# Patient Record
Sex: Female | Born: 1975 | Race: Asian | Hispanic: No | Marital: Married | Smoking: Never smoker
Health system: Southern US, Community
[De-identification: ages and names within clinical notes are randomized; demographics above are authoritative.]

---

## 2018-10-06 ENCOUNTER — Other Ambulatory Visit: Payer: Self-pay

## 2018-10-06 ENCOUNTER — Ambulatory Visit (HOSPITAL_COMMUNITY)
Admission: EM | Admit: 2018-10-06 | Discharge: 2018-10-06 | Disposition: A | Discharge status: Home / Self Care | Payer: BC Managed Care – PPO | Attending: Family Medicine | Admitting: Family Medicine

## 2018-10-06 ENCOUNTER — Encounter (HOSPITAL_COMMUNITY): Payer: Self-pay | Admitting: Emergency Medicine

## 2018-10-06 DIAGNOSIS — J111 Influenza due to unidentified influenza virus with other respiratory manifestations: Secondary | ICD-10-CM

## 2018-10-06 DIAGNOSIS — R69 Illness, unspecified: Secondary | ICD-10-CM | POA: Insufficient documentation

## 2018-10-06 MED ORDER — OSELTAMIVIR PHOSPHATE 75 MG PO CAPS: 75.0000 mg | ORAL_CAPSULE | Freq: Two times a day (BID) | ORAL | 0 refills | Status: AC

## 2018-10-06 MED ORDER — ACETAMINOPHEN 325 MG PO TABS
ORAL_TABLET | ORAL | Status: AC
  Filled 2018-10-06: qty 2

## 2018-10-06 MED ORDER — ACETAMINOPHEN 325 MG PO TABS
650.0000 mg | ORAL_TABLET | Freq: Once | ORAL | Status: AC
  Administered 2018-10-06: 650 mg via ORAL

## 2018-10-06 MED ORDER — HYDROCODONE-HOMATROPINE 5-1.5 MG/5ML PO SYRP: 5.0000 mL | ORAL_SOLUTION | Freq: Four times a day (QID) | ORAL | 0 refills | Status: AC | PRN

## 2018-10-06 NOTE — Discharge Instructions (Signed)

## 2018-10-06 NOTE — ED Provider Notes (Signed)
[facility]   [id number] 10/06/18 Arrival Time: 1643  ASSESSMENT & PLAN:  1. Influenza-like illness   Day #1  Meds ordered this encounter  Medications  . acetaminophen (TYLENOL) tablet 650 mg  . HYDROcodone-homatropine (HYCODAN) 5-1.5 MG/5ML syrup    Sig: Take 5 mLs by mouth every 6 (six) hours as needed for cough.    Dispense:  90 mL    Refill:  0  . oseltamivir (TAMIFLU) 75 MG capsule    Sig: Take 1 capsule (75 mg total) by mouth 2 (two) times daily for 5 days.    Dispense:  10 capsule    Refill:  0   Cough medication sedation precautions. Discussed typical duration of symptoms. OTC symptom care as needed. Ensure adequate fluid intake and rest. May f/u with PCP or here as needed.  Reviewed expectations re: course of current medical issues. Questions answered. Outlined signs and symptoms indicating need for more acute intervention. Patient verbalized understanding. After Visit Summary given.   SUBJECTIVE: History from: patient.  Anjolaoluwa Siguenza is a 42 y.o. female who presents with complaint of nasal congestion, post-nasal drainage, and a persistent dry cough. Onset abrupt, today. Overall with fatigue and with body aches. SOB: none. Wheezing: none. Fever: yes, subjective with chills. Overall normal PO intake without n/v. Sick contacts: no. No specific or significant aggravating or alleviating factors reported. Teaches at university. OTC treatment: none reported.  Received flu shot this year: no.  Social History   Tobacco Use  Smoking Status Not on file    ROS: As per HPI.   OBJECTIVE:  Vitals:   10/06/18 1745  BP: 117/67  Pulse: (!) 104  Resp: 16  Temp: (!) 102.5 F (39.2 C)  TempSrc: Oral  SpO2: 98%     General appearance: alert; appears fatigued HEENT: nasal congestion; clear runny nose; throat irritation secondary to post-nasal drainage Neck: supple without LAD CV: slight tachycardia; regular Lungs: unlabored respirations,  symmetrical air entry without wheezing; cough: moderate Skin: warm; dry Psychological: alert and cooperative; normal mood and affect   No Known Allergies  PMH: "Healthy."  Social History   Socioeconomic History  . Marital status: Married    Spouse name: Not on file  . Number of children: Not on file  . Years of education: Not on file  . Highest education level: Not on file  Occupational History  . Not on file  Social Needs  . Financial resource strain: Not on file  . Food insecurity:    Worry: Not on file    Inability: Not on file  . Transportation needs:    Medical: Not on file    Non-medical: Not on file  Tobacco Use  . Smoking status: Not on file  Substance and Sexual Activity  . Alcohol use: Not on file  . Drug use: Not on file  . Sexual activity: Not on file  Lifestyle  . Physical activity:    Days per week: Not on file    Minutes per session: Not on file  . Stress: Not on file  Relationships  . Social connections:    Talks on phone: Not on file    Gets together: Not on file    Attends religious service: Not on file    Active member of club or organization: Not on file    Attends meetings of clubs or organizations: Not on file    Relationship status: Not on file  . Intimate partner violence:    Fear of current  or ex partner: Not on file    Emotionally abused: Not on file    Physically abused: Not on file    Forced sexual activity: Not on file  Other Topics Concern  . Not on file  Social History Narrative  . Not on file           Mardella LaymanHagler, Suha Schoenbeck, MD 10/06/18 [phone]

## 2018-10-06 NOTE — ED Triage Notes (Signed)
PT reports fever, chills that started today.

## 2018-12-23 ENCOUNTER — Other Ambulatory Visit: Payer: Self-pay

## 2018-12-23 ENCOUNTER — Ambulatory Visit (INDEPENDENT_AMBULATORY_CARE_PROVIDER_SITE_OTHER): Payer: BLUE CROSS/BLUE SHIELD

## 2018-12-23 ENCOUNTER — Ambulatory Visit (HOSPITAL_COMMUNITY)
Admission: EM | Admit: 2018-12-23 | Discharge: 2018-12-23 | Disposition: A | Discharge status: Home / Self Care | Payer: BLUE CROSS/BLUE SHIELD | Attending: Family Medicine | Admitting: Family Medicine

## 2018-12-23 DIAGNOSIS — R42 Dizziness and giddiness: Secondary | ICD-10-CM

## 2018-12-23 DIAGNOSIS — M542 Cervicalgia: Secondary | ICD-10-CM

## 2018-12-23 DIAGNOSIS — S39012A Strain of muscle, fascia and tendon of lower back, initial encounter: Secondary | ICD-10-CM

## 2018-12-23 DIAGNOSIS — M545 Low back pain: Secondary | ICD-10-CM | POA: Diagnosis not present

## 2018-12-23 MED ORDER — CYCLOBENZAPRINE HCL 10 MG PO TABS: 10.0000 mg | ORAL_TABLET | Freq: Two times a day (BID) | ORAL | 0 refills | Status: AC | PRN

## 2018-12-23 MED ORDER — KETOROLAC TROMETHAMINE 30 MG/ML IJ SOLN
INTRAMUSCULAR | Status: AC
  Filled 2018-12-23: qty 1

## 2018-12-23 MED ORDER — DICLOFENAC SODIUM 25 MG PO TBEC: 25.0000 mg | DELAYED_RELEASE_TABLET | Freq: Two times a day (BID) | ORAL | 0 refills | Status: AC

## 2018-12-23 MED ORDER — KETOROLAC TROMETHAMINE 30 MG/ML IJ SOLN: 30.0000 mg | Freq: Once | INTRAMUSCULAR | Status: DC
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
## 2019-01-18 ENCOUNTER — Other Ambulatory Visit: Payer: Self-pay | Admitting: Family Medicine

## 2020-01-04 ENCOUNTER — Ambulatory Visit: Payer: BC Managed Care – PPO | Attending: Internal Medicine

## 2020-01-04 DIAGNOSIS — Z23 Encounter for immunization: Secondary | ICD-10-CM

## 2020-01-04 NOTE — Progress Notes (Signed)
   Covid-19 Vaccination Clinic  Name:  Kimberly Jarvis    MRN: [id number] DOB: Apr 01, 1976  01/04/2020  [name] was observed post Covid-19 immunization for 15 minutes without incident. She was provided with Vaccine Information Sheet and instruction to access the V-Safe system.   [name] was instructed to call 911 with any severe reactions post vaccine: Marland Kitchen Difficulty breathing  . Swelling of face and throat  . A fast heartbeat  . A bad rash all over body  . Dizziness and weakness   Immunizations Administered    Name Date Dose VIS Date Route   Moderna COVID-19 Vaccine 01/04/2020  4:29 PM 0.5 mL 09/26/2019 Intramuscular   Manufacturer: Moderna   Lot: [id number]   NDC: 29937-169-67

## 2020-02-06 ENCOUNTER — Ambulatory Visit: Payer: BC Managed Care – PPO | Attending: Family

## 2020-02-06 DIAGNOSIS — Z23 Encounter for immunization: Secondary | ICD-10-CM

## 2020-02-06 NOTE — Progress Notes (Signed)
   Covid-19 Vaccination Clinic  Name:  Kimberly Jarvis    MRN: [id number] DOB: Dec 17, 1975  02/06/2020  Kimberly Jarvis was observed post Covid-19 immunization for 15 minutes without incident. She was provided with Vaccine Information Sheet and instruction to access the V-Safe system.   Kimberly Jarvis was instructed to call 911 with any severe reactions post vaccine: Marland Kitchen Difficulty breathing  . Swelling of face and throat  . A fast heartbeat  . A bad rash all over body  . Dizziness and weakness   Immunizations Administered    Name Date Dose VIS Date Route   Moderna COVID-19 Vaccine 02/06/2020 12:28 PM 0.5 mL 09/26/2019 Intramuscular   Manufacturer: Moderna   Lot: [id number]   NDC: 50277-412-87

## 2021-08-04 ENCOUNTER — Encounter: Payer: Self-pay | Admitting: Internal Medicine

## 2021-08-04 ENCOUNTER — Other Ambulatory Visit: Payer: Self-pay

## 2021-08-04 ENCOUNTER — Other Ambulatory Visit (HOSPITAL_COMMUNITY): Payer: Self-pay

## 2021-08-04 ENCOUNTER — Ambulatory Visit: Payer: 59 | Admitting: Internal Medicine

## 2021-08-04 ENCOUNTER — Ambulatory Visit
Admission: RE | Admit: 2021-08-04 | Discharge: 2021-08-04 | Disposition: A | Discharge status: Home / Self Care | Payer: 59 | Source: Ambulatory Visit | Attending: Internal Medicine | Admitting: Internal Medicine

## 2021-08-04 VITALS — BP 105/71 | HR 74 | Temp 98.6°F | Wt 180.6 lb

## 2021-08-04 DIAGNOSIS — Z1159 Encounter for screening for other viral diseases: Secondary | ICD-10-CM

## 2021-08-04 DIAGNOSIS — R7612 Nonspecific reaction to cell mediated immunity measurement of gamma interferon antigen response without active tuberculosis: Secondary | ICD-10-CM

## 2021-08-04 DIAGNOSIS — Z227 Latent tuberculosis: Secondary | ICD-10-CM

## 2021-08-04 DIAGNOSIS — T371X5A Adverse effect of antimycobacterial drugs, initial encounter: Secondary | ICD-10-CM

## 2021-08-04 DIAGNOSIS — Z9189 Other specified personal risk factors, not elsewhere classified: Secondary | ICD-10-CM | POA: Diagnosis not present

## 2021-08-04 NOTE — Patient Instructions (Signed)
-  Labs and chest xray today -Once results return will plan to start isoniazid and B6 50mg  -Pharmacy follow-up in 1 month -Follow-up with Dr. in 9 months

## 2021-08-04 NOTE — Progress Notes (Signed)
Patient: Kimberly Jarvis  DOB: 19-Dec-1975 MRN: [id number] PCP: Patient, No Pcp Per (Inactive)  Referring Provider: Dr. Suzie Portela  Possible TB exposure   Subjective:  Kimberly Jarvis How is a 45 y.o. F with hypothyroidism, presents due to positive IGRA.  TB test was done as part of immigration physical in July, 2021.  Chest x-ray at that time was negative(08/23/21).  She was referred to infectious disease for further management. Patient immigrated from [place] in January 2013. Lived [place] in [place]. She is a fashion Adult nurse at [employer]  She has remained asymptomatic.  She denies fever, unintentional weight loss, SHOB, hemoptysis, cough. Does not recall tb contacts. Denies prior treatment for tb. She lives in [place]  [facility]   Review of Systems  Constitutional: Negative.   HENT: Negative.    Respiratory:  Negative for cough, hemoptysis and shortness of breath.   Cardiovascular: Negative.   Gastrointestinal: Negative.  Negative for diarrhea, nausea and vomiting.  Genitourinary: Negative.   Musculoskeletal: Negative.   Neurological:  Negative for headaches.  Endo/Heme/Allergies: Negative.   Psychiatric/Behavioral: Negative.     No past medical history on file.  Outpatient Medications Prior to Visit  Medication Sig Dispense Refill   levothyroxine (SYNTHROID, LEVOTHROID) 50 MCG tablet Take 50 mcg by mouth daily before breakfast.     cyclobenzaprine (FLEXERIL) 10 MG tablet Take 1 tablet (10 mg total) by mouth 2 (two) times daily as needed for muscle spasms. (Patient not taking: Reported on 08/04/2021) 20 tablet 0   diclofenac (VOLTAREN) 25 MG EC tablet Take 1 tablet (25 mg total) by mouth 2 (two) times daily. (Patient not taking: Reported on 08/04/2021) 30 tablet 0   HYDROcodone-homatropine (HYCODAN) 5-1.5 MG/5ML syrup Take 5 mLs by mouth every 6 (six) hours as needed for cough. (Patient not taking: Reported on 08/04/2021) 90 mL 0   No facility-administered  medications prior to visit.     No Known Allergies   Objective:   Vitals:   08/04/21 0858  BP: 105/71  Pulse: 74  Temp: 98.6 F (37 C)  TempSrc: Oral  SpO2: 98%  Weight: 81.9 kg   Body mass index is 30.05 kg/m.  Physical Exam Constitutional:      Appearance: Normal appearance.  HENT:     Head: Normocephalic and atraumatic.     Right Ear: Tympanic membrane normal.     Left Ear: Tympanic membrane normal.     Nose: Nose normal.     Mouth/Throat:     Mouth: Mucous membranes are moist.  Eyes:     Extraocular Movements: Extraocular movements intact.     Conjunctiva/sclera: Conjunctivae normal.     Pupils: Pupils are equal, round, and reactive to light.  Cardiovascular:     Rate and Rhythm: Normal rate and regular rhythm.     Heart sounds: No murmur heard.   No friction rub. No gallop.  Pulmonary:     Effort: Pulmonary effort is normal.     Breath sounds: Normal breath sounds.  Abdominal:     General: Abdomen is flat.     Palpations: Abdomen is soft.  Musculoskeletal:        General: Normal range of motion.  Skin:    General: Skin is warm and dry.  Neurological:     General: No focal deficit present.     Mental Status: She is alert and oriented to person, place, and time.  Psychiatric:        Mood and Affect: Mood normal.  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 Will repeat testing (IGRA) for our records and CXR. She has been asymptomatic, as such will plan to start INH+B6 x 9 months.  Plan -Labs: HIV, Hep panel, cbc, cmp, repeat IGRA and Chest xray -Plan to start INH 300 mg/day+ B6 x  9 months once labs return -Discussed adverse effects of INH including hepatoxity and peripheral nueropathy -Shared decision making to avoid  rifampin due to interaction with synthroid -Monthly cmp -Follow-up in 1 month with pharmacy -Follow with ID provider at the end of treatment   Danelle Earthly, MD [facility]   08/04/21  12:21 PM

## 2021-08-08 LAB — HIV ANTIBODY (ROUTINE TESTING W REFLEX): HIV 1&2 Ab, 4th Generation: NONREACTIVE

## 2021-08-08 LAB — CBC WITH DIFFERENTIAL/PLATELET
Absolute Monocytes: 382 cells/uL (ref 200–950)
Basophils Absolute: 80 cells/uL (ref 0–200)
Basophils Relative: 1.2 %
Eosinophils Absolute: 107 cells/uL (ref 15–500)
Eosinophils Relative: 1.6 %
HCT: 37.1 % (ref 35.0–45.0)
Hemoglobin: 12.1 g/dL (ref 11.7–15.5)
Lymphs Abs: 1762 cells/uL (ref 850–3900)
MCH: 28.9 pg (ref 27.0–33.0)
MCHC: 32.6 g/dL (ref 32.0–36.0)
MCV: 88.8 fL (ref 80.0–100.0)
MPV: 11.5 fL (ref 7.5–12.5)
Monocytes Relative: 5.7 %
Neutro Abs: 4368 cells/uL (ref 1500–7800)
Neutrophils Relative %: 65.2 %
Platelets: 284 10*3/uL (ref 140–400)
RBC: 4.18 10*6/uL (ref 3.80–5.10)
RDW: 12.2 % (ref 11.0–15.0)
Total Lymphocyte: 26.3 %
WBC: 6.7 10*3/uL (ref 3.8–10.8)

## 2021-08-08 LAB — QUANTIFERON-TB GOLD PLUS
Mitogen-NIL: 7.6 IU/mL
NIL: 0.05 IU/mL
QuantiFERON-TB Gold Plus: POSITIVE — AB
TB1-NIL: 1.78 IU/mL
TB2-NIL: 1.59 IU/mL

## 2021-08-08 LAB — COMPLETE METABOLIC PANEL WITH GFR
AG Ratio: 1.5 (calc) (ref 1.0–2.5)
ALT: 14 U/L (ref 6–29)
AST: 15 U/L (ref 10–30)
Albumin: 4.4 g/dL (ref 3.6–5.1)
Alkaline phosphatase (APISO): 86 U/L (ref 31–125)
BUN: 12 mg/dL (ref 7–25)
CO2: 28 mmol/L (ref 20–32)
Calcium: 9.5 mg/dL (ref 8.6–10.2)
Chloride: 103 mmol/L (ref 98–110)
Creat: 0.77 mg/dL (ref 0.50–0.99)
Globulin: 2.9 g/dL (calc) (ref 1.9–3.7)
Glucose, Bld: 91 mg/dL (ref 65–99)
Potassium: 4.3 mmol/L (ref 3.5–5.3)
Sodium: 139 mmol/L (ref 135–146)
Total Bilirubin: 0.3 mg/dL (ref 0.2–1.2)
Total Protein: 7.3 g/dL (ref 6.1–8.1)
eGFR: 98 mL/min/{1.73_m2} (ref >=60)

## 2021-08-08 LAB — HEPATITIS A ANTIBODY, TOTAL: Hepatitis A AB,Total: REACTIVE — AB

## 2021-08-08 LAB — HEPATITIS B SURFACE ANTIBODY,QUALITATIVE: Hep B S Ab: REACTIVE — AB

## 2021-08-08 LAB — HEPATITIS C ANTIBODY
Hepatitis C Ab: NONREACTIVE
SIGNAL TO CUT-OFF: 0.01 (ref <1.00)

## 2021-08-08 LAB — HEPATITIS B SURFACE ANTIGEN: Hepatitis B Surface Ag: NONREACTIVE

## 2021-08-08 LAB — HEPATITIS B CORE ANTIBODY, IGM: Hep B C IgM: NONREACTIVE

## 2021-08-11 ENCOUNTER — Telehealth: Payer: Self-pay

## 2021-08-11 ENCOUNTER — Other Ambulatory Visit: Payer: Self-pay | Admitting: Internal Medicine

## 2021-08-11 MED ORDER — VITAMIN B-6 50 MG PO TABS: 50.0000 mg | ORAL_TABLET | Freq: Every day | ORAL | 9 refills | Status: DC

## 2021-08-11 MED ORDER — ISONIAZID 300 MG PO TABS: 300.0000 mg | ORAL_TABLET | Freq: Every day | ORAL | 9 refills | Status: DC

## 2021-08-11 NOTE — Telephone Encounter (Signed)
Left voicemail letting patient know that prescriptions have been sent in for INH and Vitamin B. Reminded her of follow up appointment with pharmacy in 1 month.   Si Jachim Loyola Mast, RN

## 2021-09-05 ENCOUNTER — Ambulatory Visit: Payer: Self-pay | Admitting: Pharmacist

## 2021-09-05 ENCOUNTER — Telehealth: Payer: Self-pay

## 2021-09-05 DIAGNOSIS — Z227 Latent tuberculosis: Secondary | ICD-10-CM

## 2021-09-05 MED ORDER — VITAMIN B-6 50 MG PO TABS: 50.0000 mg | ORAL_TABLET | Freq: Every day | ORAL | 9 refills | Status: AC

## 2021-09-05 MED ORDER — ISONIAZID 300 MG PO TABS: 300.0000 mg | ORAL_TABLET | Freq: Every day | ORAL | 9 refills | Status: AC

## 2021-09-05 NOTE — Telephone Encounter (Signed)
Patient called requesting her TB medications be sent to [facility] on [address]. On review, medications were previously ordered as "print" and were not transmitted to the pharmacy. Reordering and sending to patient's preferred pharmacy.    Sandie Ano, RN

## 2021-09-05 NOTE — Telephone Encounter (Signed)
Patient called in regards to appointment and needing it to be rescheduled due to not taking medication yet, spoke with Marchelle Folks in regards to rescheduling appointment for 3/4 weeks out so patient could get medication started and follow up afterwards. Also spoke with nursing staff about patient needing medication switched to a different pharmacy.

## 2021-10-04 ENCOUNTER — Ambulatory Visit (INDEPENDENT_AMBULATORY_CARE_PROVIDER_SITE_OTHER): Payer: BC Managed Care – PPO

## 2021-10-04 ENCOUNTER — Encounter (HOSPITAL_COMMUNITY): Payer: Self-pay

## 2021-10-04 ENCOUNTER — Other Ambulatory Visit: Payer: Self-pay

## 2021-10-04 ENCOUNTER — Ambulatory Visit (HOSPITAL_COMMUNITY)
Admission: EM | Admit: 2021-10-04 | Discharge: 2021-10-04 | Disposition: A | Discharge status: Home / Self Care | Payer: BC Managed Care – PPO | Attending: Emergency Medicine | Admitting: Emergency Medicine

## 2021-10-04 DIAGNOSIS — S9031XA Contusion of right foot, initial encounter: Secondary | ICD-10-CM | POA: Diagnosis not present

## 2021-10-04 DIAGNOSIS — M79671 Pain in right foot: Secondary | ICD-10-CM | POA: Diagnosis not present

## 2021-10-04 MED ORDER — MELOXICAM 7.5 MG PO TABS: 15.0000 mg | ORAL_TABLET | Freq: Every day | ORAL | 0 refills | Status: AC

## 2021-10-04 NOTE — ED Provider Notes (Signed)
[facility]  ____________________________________________  Time seen: Approximately 12:21 PM  I have reviewed the triage vital signs and the nursing notes.   HISTORY  Chief Complaint No chief complaint on file.   Historian Patient     HPI Kimberly Jarvis is a 45 y.o. female presents to the urgent care with pain along the dorsal aspect of the right foot after a large margarita glass fell on patient's right big toe yesterday.  Patient has had some bruising along the dorsal aspect of toe.  No similar injuries in the past.  Patient has been able to ambulate.   History reviewed. No pertinent past medical history.   Immunizations up to date:  Yes.     History reviewed. No pertinent past medical history.  There are no problems to display for this patient.   Past Surgical History:  Procedure Laterality Date   CESAREAN SECTION      Prior to Admission medications   Medication Sig Start Date End Date Taking? Authorizing Provider  meloxicam (MOBIC) 7.5 MG tablet Take 2 tablets (15 mg total) by mouth daily for 7 days. 10/04/21 10/11/21 Yes Pia Mau M, PA-C  cyclobenzaprine (FLEXERIL) 10 MG tablet Take 1 tablet (10 mg total) by mouth 2 (two) times daily as needed for muscle spasms. Patient not taking: Reported on 08/04/2021 12/23/18   Bing Neighbors, FNP  diclofenac (VOLTAREN) 25 MG EC tablet Take 1 tablet (25 mg total) by mouth 2 (two) times daily. Patient not taking: Reported on 08/04/2021 12/23/18   Bing Neighbors, FNP  HYDROcodone-homatropine [facility]) 5-1.5 MG/5ML syrup Take 5 mLs by mouth every 6 (six) hours as needed for cough. Patient not taking: Reported on 08/04/2021 10/06/18   Mardella Layman, MD  isoniazid (NYDRAZID) 300 MG tablet Take 1 tablet (300 mg total) by mouth daily. 09/05/21   Danelle Earthly, MD  levothyroxine (SYNTHROID, LEVOTHROID) 50 MCG tablet Take 50 mcg by mouth daily before breakfast.    [provider]  pyridOXINE  (VITAMIN B-6) 50 MG tablet Take 1 tablet (50 mg total) by mouth daily. 09/05/21   Danelle Earthly, MD    Allergies Patient has no known allergies.  No family history on file.  Social History Social History   Tobacco Use   Smoking status: Never   Smokeless tobacco: Never  Substance Use Topics   Alcohol use: Not Currently   Drug use: Never     Review of Systems  Constitutional: No fever/chills Eyes:  No discharge ENT: No upper respiratory complaints. Respiratory: no cough. No SOB/ use of accessory muscles to breath Gastrointestinal:   No nausea, no vomiting.  No diarrhea.  No constipation. Musculoskeletal: Patient has toe pain.  Skin: Negative for rash, abrasions, lacerations, ecchymosis.   ____________________________________________   PHYSICAL EXAM:  VITAL SIGNS: ED Triage Vitals  Enc Vitals Group     BP 10/04/21 1119 116/82     Pulse Rate 10/04/21 1119 77     Resp 10/04/21 1119 16     Temp 10/04/21 1119 98.7 F (37.1 C)     Temp Source 10/04/21 1119 Oral     SpO2 10/04/21 1119 100 %     Weight --      Height --      Head Circumference --      Peak Flow --      Pain Score 10/04/21 1120 8     Pain Loc --      Pain Edu? --      Excl.  in GC? --      Constitutional: Alert and oriented. Well appearing and in no acute distress. Eyes: Conjunctivae are normal. PERRL. EOMI. Head: Atraumatic. ENT:  Cardiovascular: Normal rate, regular rhythm. Normal S1 and S2.  Good peripheral circulation. Respiratory: Normal respiratory effort without tachypnea or retractions. Lungs CTAB. Good air entry to the bases with no decreased or absent breath sounds Gastrointestinal: Bowel sounds x 4 quadrants. Soft and nontender to palpation. No guarding or rigidity. No distention. Musculoskeletal: Full range of motion to all extremities. No obvious deformities noted. Palpable dorsalis pedis pulse, right. Capillary refill less than 2 seconds on the right.   Neurologic:  Normal for age.  No gross focal neurologic deficits are appreciated.  Skin:  Skin is warm, dry and intact. No rash noted. Psychiatric: Mood and affect are normal for age. Speech and behavior are normal.   ____________________________________________   LABS (all labs ordered are listed, but only abnormal results are displayed)  Labs Reviewed - No data to display ____________________________________________  EKG   ____________________________________________  RADIOLOGY Geraldo Pitter, personally viewed and evaluated these images (plain radiographs) as part of my medical decision making, as well as reviewing the written report by the radiologist.    DG Foot Complete Right  Result Date: 10/04/2021 CLINICAL DATA:  Right foot pain after injury yesterday. EXAM: RIGHT FOOT COMPLETE - 3+ VIEW COMPARISON:  None. FINDINGS: There is no evidence of fracture or dislocation. There is no evidence of arthropathy or other focal bone abnormality. Soft tissues are unremarkable. IMPRESSION: Negative. Electronically Signed   By: Lupita Raider M.D.   On: 10/04/2021 12:40    ____________________________________________    PROCEDURES  Procedure(s) performed:     Procedures     Medications - No data to display   ____________________________________________   INITIAL IMPRESSION / ASSESSMENT AND PLAN / ED COURSE  Pertinent labs & imaging results that were available during my care of the patient were reviewed by me and considered in my medical decision making (see chart for details).      Assessment and plan: Right foot pain:  45 year old female presents to the urgent care with pain of the right great toe.  No bony abnormality on x-ray.  Meloxicam was prescribed for pain.  Recommended a supportive shoe with a wide toe box.    ____________________________________________  FINAL CLINICAL IMPRESSION(S) / ED DIAGNOSES  Final diagnoses:  Contusion of right foot, initial encounter      NEW  MEDICATIONS STARTED DURING THIS VISIT:  ED Discharge Orders          Ordered    meloxicam (MOBIC) 7.5 MG tablet  Daily        10/04/21 1246                This chart was dictated using voice recognition software/Dragon. Despite best efforts to proofread, errors can occur which can change the meaning. Any change was purely unintentional.     Orvil Feil, PA-C 10/04/21 1404

## 2021-10-04 NOTE — ED Triage Notes (Signed)
Patient presents to the office today for right toe pain x 1 day.

## 2021-10-04 NOTE — Discharge Instructions (Signed)
Take Meloxicam once daily for pain and inflammation.  Keep foot elevated at night. Please apply ice for inflammation.

## 2021-10-07 ENCOUNTER — Ambulatory Visit: Payer: 59 | Admitting: Pharmacist

## 2022-06-18 IMAGING — DX DG FOOT COMPLETE 3+V*R*
3 series · 3 of 3 positions shown · non-contrast
Comparison: None.

CLINICAL DATA: Right foot pain after injury yesterday.

EXAM:
RIGHT FOOT COMPLETE - 3+ VIEW

[foot ap]
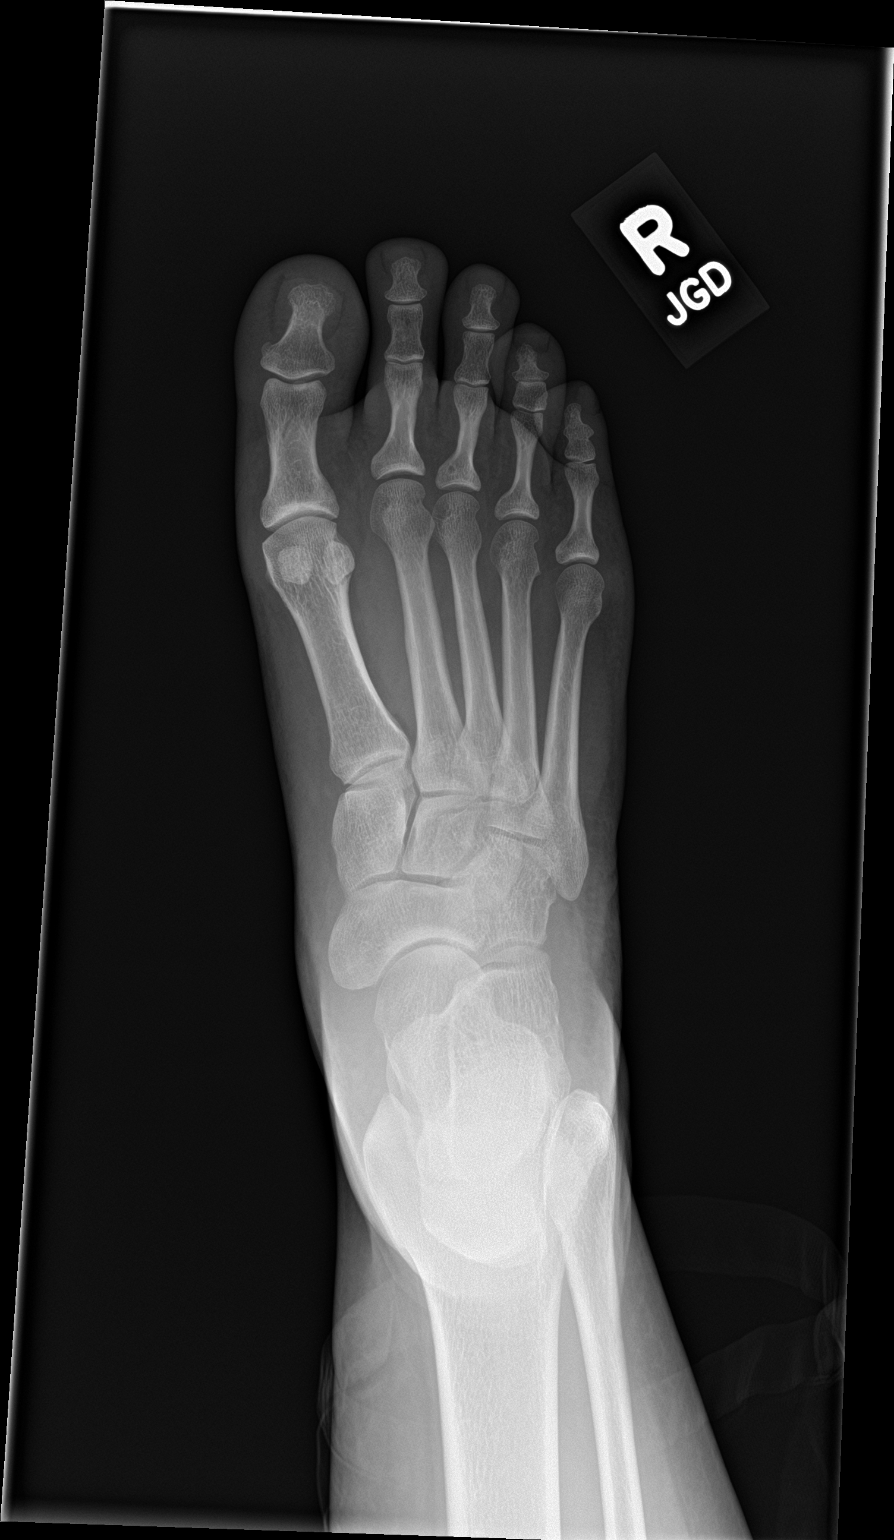

[foot obl]
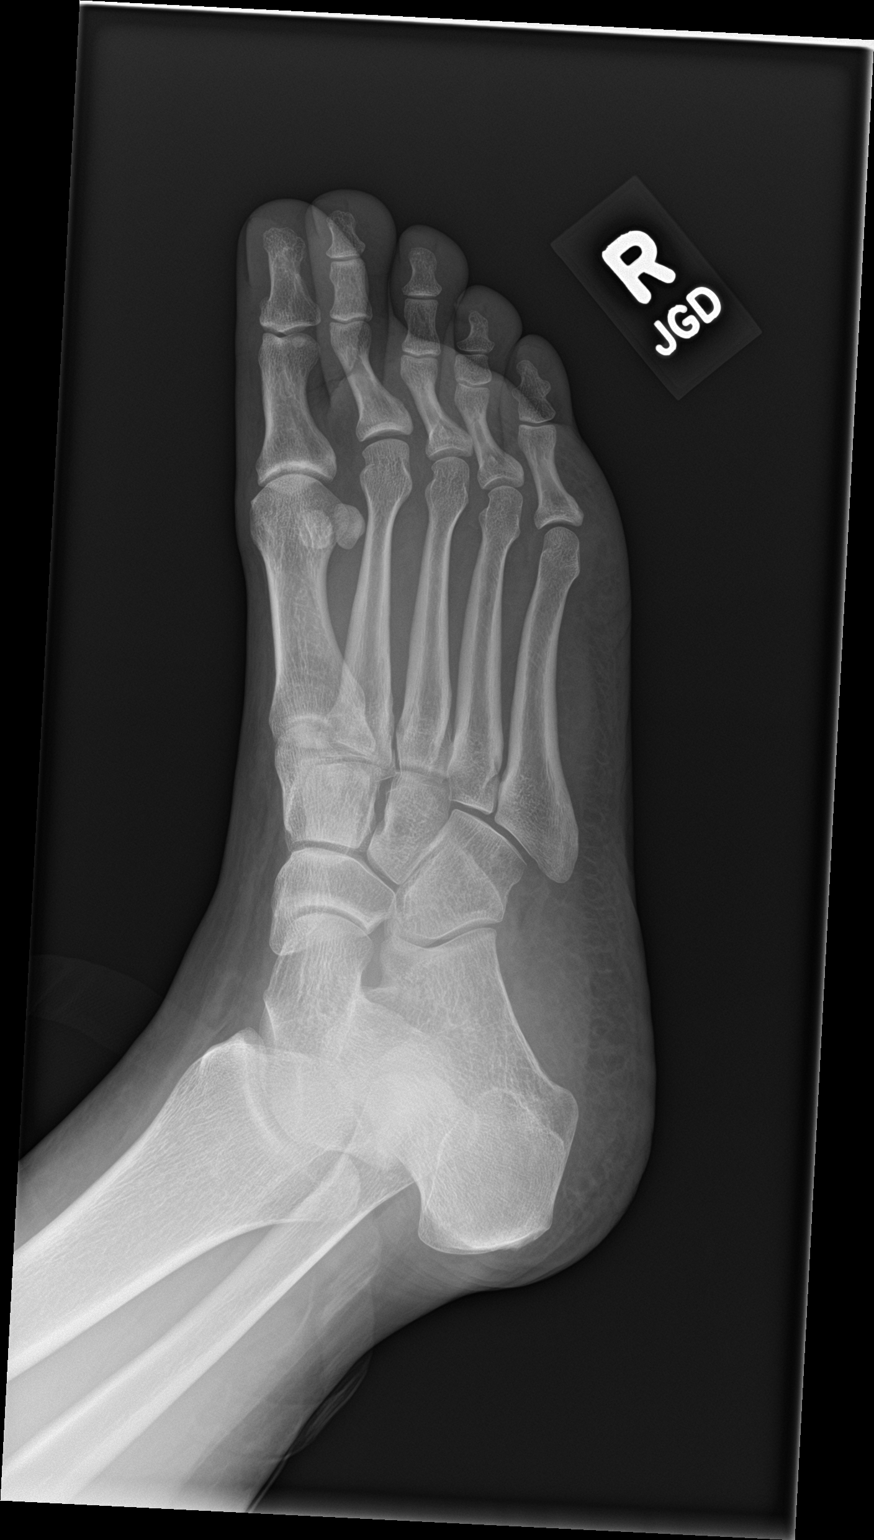

[foot lat]
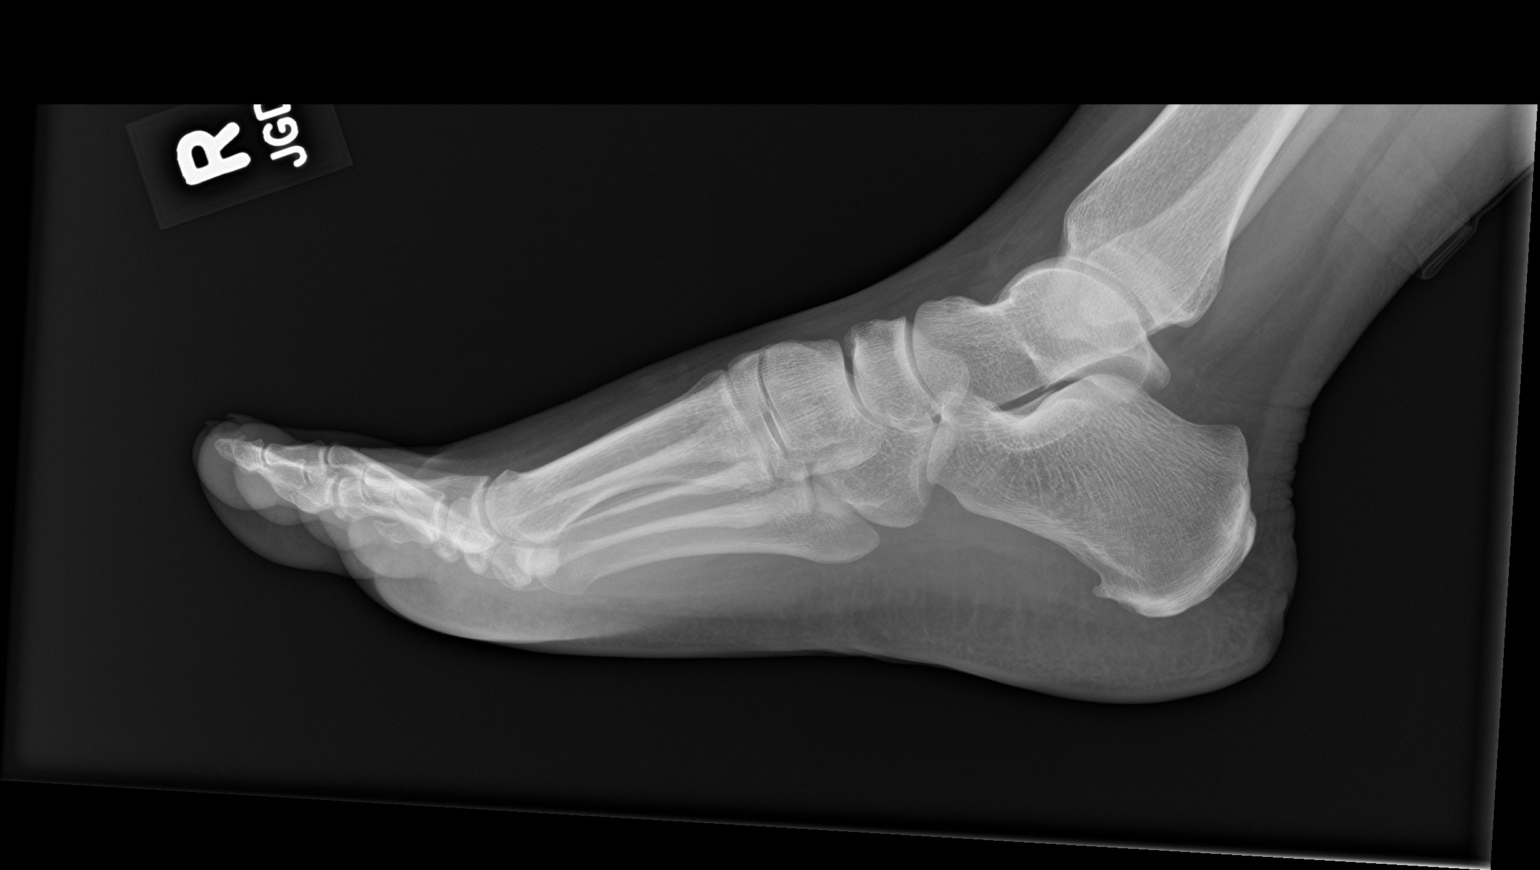

[3 of 3 positions shown; findings below may reference images not displayed]

FINDINGS: There is no evidence of fracture or dislocation. There is no
evidence of arthropathy or other focal bone abnormality. Soft
tissues are unremarkable.
IMPRESSION: Negative.
